# Patient Record
Sex: Male | Born: 1980 | Race: White | Hispanic: No | Marital: Married | Smoking: Never smoker
Health system: Southern US, Community
[De-identification: ages and names within clinical notes are randomized; demographics above are authoritative.]

## PROBLEM LIST (undated history)

## (undated) DIAGNOSIS — IMO0001 Reserved for inherently not codable concepts without codable children: Secondary | ICD-10-CM

## (undated) DIAGNOSIS — K219 Gastro-esophageal reflux disease without esophagitis: Secondary | ICD-10-CM

---

## 2011-02-11 ENCOUNTER — Encounter (HOSPITAL_BASED_OUTPATIENT_CLINIC_OR_DEPARTMENT_OTHER): Payer: Self-pay | Admitting: *Deleted

## 2011-02-11 ENCOUNTER — Emergency Department (HOSPITAL_BASED_OUTPATIENT_CLINIC_OR_DEPARTMENT_OTHER)
Admission: EM | Admit: 2011-02-11 | Discharge: 2011-02-12 | Disposition: A | Discharge status: Home / Self Care | Payer: Worker's Compensation | Attending: Emergency Medicine | Admitting: Emergency Medicine

## 2011-02-11 DIAGNOSIS — S51809A Unspecified open wound of unspecified forearm, initial encounter: Secondary | ICD-10-CM | POA: Insufficient documentation

## 2011-02-11 DIAGNOSIS — W268XXA Contact with other sharp object(s), not elsewhere classified, initial encounter: Secondary | ICD-10-CM | POA: Insufficient documentation

## 2011-02-11 DIAGNOSIS — S51811A Laceration without foreign body of right forearm, initial encounter: Secondary | ICD-10-CM

## 2011-02-11 MED ORDER — LIDOCAINE HCL 2 % IJ SOLN
INTRAMUSCULAR | Status: AC
  Administered 2011-02-11: 3 mL
  Filled 2011-02-11: qty 1

## 2011-02-11 NOTE — ED Notes (Signed)
Pt was at work when a commercial roll of alluminum foil fell on his arm pt states that the serrated edge cut his arm

## 2011-02-11 NOTE — ED Provider Notes (Signed)
History     CSN: [id number]  Arrival date & time 02/11/11  2313   First MD Initiated Contact with Patient 02/11/11 2339      Chief Complaint  Patient presents with  . Laceration    (Consider location/radiation/quality/duration/timing/severity/associated sxs/prior treatment) Patient is a 31 y.o. male presenting with skin laceration. The history is provided by the patient.  Laceration  The incident occurred less than 1 hour ago. The laceration is located on the right arm. The laceration is 3 cm in size. 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 The pain is mild. The pain has been constant since onset. He reports no foreign bodies present.    History reviewed. No pertinent past medical history.  History reviewed. No pertinent past surgical history.  History reviewed. No pertinent family history.  History  Substance Use Topics  . Smoking status: Never Smoker   . Smokeless tobacco: Not on file  . Alcohol Use: No      Review of Systems  All other systems reviewed and are negative.    Allergies  Review of patient's allergies indicates no known allergies.  Home Medications  No current outpatient prescriptions on file.  BP 151/95  Pulse 67  Temp(Src) 98.2 F (36.8 C) (Oral)  Resp 16  SpO2 98%  Physical Exam  Nursing note and vitals reviewed. Constitutional: He is oriented to person, place, and time. He appears well-developed and well-nourished.  HENT:  Head: Normocephalic and atraumatic.  Neck: Normal range of motion. Neck supple.  Musculoskeletal:       The lateral aspect of the right forearm just distal to the elbow has a 3 cm laceration.  There is no muscle involvement.  Neurological: He is alert and oriented to person, place, and time.  Skin: Skin is warm and dry.    ED Course  Procedures (including critical care time)  Labs Reviewed - No data to display No results found.   No diagnosis found.  LACERATION  REPAIR Performed by: Geoffery Lyons Authorized by: Geoffery Lyons Consent: Verbal consent obtained. Risks and benefits: risks, benefits and alternatives were discussed Consent given by: patient Patient identity confirmed: provided demographic data Prepped and Draped in normal sterile fashion Wound explored  Laceration Location: right forearm  Laceration Length: 3 cm  No Foreign Bodies seen or palpated  Anesthesia: local infiltration  Local anesthetic: lidocaine 1% without epinephrine  Anesthetic total: 2 ml  Irrigation method: syringe Amount of cleaning: standard  Skin closure: 4-0 prolene  Number of sutures: 5  Technique: simple interrupted.  Patient tolerance: Patient tolerated the procedure well with no immediate complications.   MDM  Local wound care, sutures out in one week.        Geoffery Lyons, MD 02/11/11 [phone]

## 2011-02-12 MED ORDER — TETANUS-DIPHTH-ACELL PERTUSSIS 5-2.5-18.5 LF-MCG/0.5 IM SUSP
0.5000 mL | Freq: Once | INTRAMUSCULAR | Status: AC
  Administered 2011-02-12: 0.5 mL via INTRAMUSCULAR
  Filled 2011-02-12: qty 0.5

## 2012-03-05 ENCOUNTER — Emergency Department (HOSPITAL_BASED_OUTPATIENT_CLINIC_OR_DEPARTMENT_OTHER)
Admission: EM | Admit: 2012-03-05 | Discharge: 2012-03-05 | Disposition: A | Discharge status: Home / Self Care | Payer: Worker's Compensation | Attending: Emergency Medicine | Admitting: Emergency Medicine

## 2012-03-05 ENCOUNTER — Encounter (HOSPITAL_BASED_OUTPATIENT_CLINIC_OR_DEPARTMENT_OTHER): Payer: Self-pay | Admitting: *Deleted

## 2012-03-05 DIAGNOSIS — Y990 Civilian activity done for income or pay: Secondary | ICD-10-CM | POA: Insufficient documentation

## 2012-03-05 DIAGNOSIS — Y9289 Other specified places as the place of occurrence of the external cause: Secondary | ICD-10-CM | POA: Insufficient documentation

## 2012-03-05 DIAGNOSIS — S058X9A Other injuries of unspecified eye and orbit, initial encounter: Secondary | ICD-10-CM | POA: Insufficient documentation

## 2012-03-05 DIAGNOSIS — H53149 Visual discomfort, unspecified: Secondary | ICD-10-CM | POA: Insufficient documentation

## 2012-03-05 DIAGNOSIS — Y939 Activity, unspecified: Secondary | ICD-10-CM | POA: Insufficient documentation

## 2012-03-05 DIAGNOSIS — S0500XA Injury of conjunctiva and corneal abrasion without foreign body, unspecified eye, initial encounter: Secondary | ICD-10-CM

## 2012-03-05 DIAGNOSIS — IMO0002 Reserved for concepts with insufficient information to code with codable children: Secondary | ICD-10-CM | POA: Insufficient documentation

## 2012-03-05 MED ORDER — FLUORESCEIN SODIUM 1 MG OP STRP
ORAL_STRIP | OPHTHALMIC | Status: AC
  Filled 2012-03-05: qty 1

## 2012-03-05 MED ORDER — TETRACAINE HCL 0.5 % OP SOLN
OPHTHALMIC | Status: AC
  Filled 2012-03-05: qty 2

## 2012-03-05 MED ORDER — MOXIFLOXACIN HCL 0.5 % OP SOLN: 1.0000 [drp] | Freq: Three times a day (TID) | OPHTHALMIC | Status: DC

## 2012-03-05 NOTE — ED Notes (Signed)
Pt not wearing corrective lenses during visual screening.

## 2012-03-05 NOTE — ED Notes (Signed)
Left eye injury at work last night-states was hit with cardboard to fae/eye-eye was flushed-cont'd to have FB sensation

## 2012-03-05 NOTE — ED Provider Notes (Signed)
History     CSN: [id number]  Arrival date & time 03/05/12  1806   First MD Initiated Contact with Patient 03/05/12 1829      Chief Complaint  Patient presents with  . Eye Injury    (Consider location/radiation/quality/duration/timing/severity/associated sxs/prior treatment) Patient is a 32 y.o. male presenting with eye injury. The history is provided by the patient.  Eye Injury   Patient with left eye injury that occurred last at work. Had a piece of carpet scrape against his left thigh. His eye was irrigated at that time and he continues to perform by sensation. Some eye drainage with photophobia. Some blurred vision but no visual loss. No other complaints History reviewed. No pertinent past medical history.  History reviewed. No pertinent past surgical history.  No family history on file.  History  Substance Use Topics  . Smoking status: Never Smoker   . Smokeless tobacco: Not on file  . Alcohol Use: No      Review of Systems  All other systems reviewed and are negative.    Allergies  Review of patient's allergies indicates no known allergies.  Home Medications  No current outpatient prescriptions on file.  BP 129/74  Pulse 75  Temp 98.2 F (36.8 C) (Oral)  Resp 16  Ht 5\' 9"  (1.753 m)  Wt 185 lb (83.915 kg)  BMI 27.32 kg/m2  SpO2 100%  Physical Exam  Nursing note and vitals reviewed. Constitutional: He is oriented to person, place, and time. He appears well-developed and well-nourished.  Non-toxic appearance.  HENT:  Head: Normocephalic and atraumatic.  Eyes: Pupils are equal, round, and reactive to light. Left eye exhibits discharge. No foreign body present in the left eye. Left conjunctiva is injected.       Fluorescein uptake noted at the 5:00 region of the left eye.  Neck: Normal range of motion.  Cardiovascular: Normal rate.   Pulmonary/Chest: Effort normal.  Neurological: He is alert and oriented to person, place, and time.  Skin: Skin is warm  and dry.  Psychiatric: He has a normal mood and affect.    ED Course  Procedures (including critical care time)  Labs Reviewed - No data to display No results found.   No diagnosis found.    MDM  Patient to be treated for corneal abrasion and will followup with his ophthalmologist        Toy Baker, MD 03/05/12 1843

## 2015-01-05 ENCOUNTER — Emergency Department (HOSPITAL_BASED_OUTPATIENT_CLINIC_OR_DEPARTMENT_OTHER)
Admission: EM | Admit: 2015-01-05 | Discharge: 2015-01-05 | Disposition: A | Discharge status: Home / Self Care | Payer: BLUE CROSS/BLUE SHIELD | Attending: Emergency Medicine | Admitting: Emergency Medicine

## 2015-01-05 ENCOUNTER — Emergency Department (HOSPITAL_BASED_OUTPATIENT_CLINIC_OR_DEPARTMENT_OTHER): Payer: BLUE CROSS/BLUE SHIELD

## 2015-01-05 ENCOUNTER — Encounter (HOSPITAL_BASED_OUTPATIENT_CLINIC_OR_DEPARTMENT_OTHER): Payer: Self-pay

## 2015-01-05 DIAGNOSIS — Y998 Other external cause status: Secondary | ICD-10-CM | POA: Insufficient documentation

## 2015-01-05 DIAGNOSIS — S39012A Strain of muscle, fascia and tendon of lower back, initial encounter: Secondary | ICD-10-CM | POA: Insufficient documentation

## 2015-01-05 DIAGNOSIS — Y92410 Unspecified street and highway as the place of occurrence of the external cause: Secondary | ICD-10-CM | POA: Insufficient documentation

## 2015-01-05 DIAGNOSIS — S299XXA Unspecified injury of thorax, initial encounter: Secondary | ICD-10-CM | POA: Insufficient documentation

## 2015-01-05 DIAGNOSIS — R05 Cough: Secondary | ICD-10-CM | POA: Insufficient documentation

## 2015-01-05 DIAGNOSIS — Y9389 Activity, other specified: Secondary | ICD-10-CM | POA: Insufficient documentation

## 2015-01-05 DIAGNOSIS — S199XXA Unspecified injury of neck, initial encounter: Secondary | ICD-10-CM | POA: Diagnosis present

## 2015-01-05 DIAGNOSIS — S161XXA Strain of muscle, fascia and tendon at neck level, initial encounter: Secondary | ICD-10-CM | POA: Insufficient documentation

## 2015-01-05 DIAGNOSIS — S0990XA Unspecified injury of head, initial encounter: Secondary | ICD-10-CM | POA: Diagnosis not present

## 2015-01-05 MED ORDER — NAPROXEN 500 MG PO TABS: 500.0000 mg | ORAL_TABLET | Freq: Two times a day (BID) | ORAL | Status: DC
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
## 2015-01-05 NOTE — Discharge Instructions (Signed)
X-rays and CT scans were negative for any acute bony injuries. Take the Naprosyn as directed. Make an appointment to follow-up with your doctor. If not improving over the next few days or for any new or worse symptoms return here.   Emergency Department Resource Guide 1) Find a Doctor and Pay Out of Pocket Although you won't have to find out who is covered by your insurance plan, it is a good idea to ask around and get recommendations. You will then need to call the office and see if the doctor you have chosen will accept you as a new patient and what types of options they offer for patients who are self-pay. Some doctors offer discounts or will set up payment plans for their patients who do not have insurance, but you will need to ask so you aren't surprised when you get to your appointment.  2) Contact Your Local Health Department Not all health departments have doctors that can see patients for sick visits, but many do, so it is worth a call to see if yours does. If you don't know where your local health department is, you can check in your phone book. The CDC also has a tool to help you locate your state's health department, and many state websites also have listings of all of their local health departments.  3) Find a Walk-in Clinic If your illness is not likely to be very severe or complicated, you may want to try a walk in clinic. These are popping up all over the country in pharmacies, drugstores, and shopping centers. They're usually staffed by nurse practitioners or physician assistants that have been trained to treat common illnesses and complaints. They're usually fairly quick and inexpensive. However, if you have serious medical issues or chronic medical problems, these are probably not your best option.  No Primary Care Doctor: - Call Health Connect at  [phone] - they can help you locate a primary care doctor that  accepts your insurance, provides certain services, etc. - Physician  Referral Service- [phone]  Chronic Pain Problems: Organization         Address  Phone   Notes  [facility]  [phone] Patients need to be referred by their primary care doctor.   Medication Assistance: Organization         Address  Phone   Notes  [facility]ication [facility] [address] [place], [place] [address] [phone] --Must be a resident of [facility] [place] -- Must have NO insurance coverage whatsoever (no Medicaid/ Medicare, etc.) -- The pt. MUST have a primary care doctor that directs their care regularly and follows them in the community   [facility]ist  [phone]   [employer]  [phone]    Agencies that provide inexpensive medical care: Organization         Address  Phone   Notes  [facility]  [phone]   [facility]    [phone]   [facility] [address] [place], [place] [address] [phone]   [facility] [address], [place] [phone]   [facility]anned Parenthood    [phone]   [facility]    [phone]   [facility]  [address], [place] Phone:  [phone], Fax:  [phone] Hours of Operation:  9 am - 6 pm, M-F.  Also accepts Medicaid/Medicare  and self-pay.  [facility] for [place], [address], [place] Phone: [phone], Fax: [phone]. Hours of Operation:  8:30 am - 5:30 pm, M-F.  Also accepts Medicaid and self-pay.  [facility] [place] [address] [place] Phone: [phone]   [place], [place], [place] [phone], Ext. [phone] Mondays & Thursdays: 7-9 AM.  First 15 patients are seen on a first come, first serve basis.    [place] Providers:  Organization         Address  Phone   Notes  [facility] [address],  [phone] Also accepts self-pay patients.  [facility] [address], [place]  [phone]   [place], [address], [place] [phone]   [facility] [address], [place] [phone]   Lucianne Lei [address], [place]   [phone] Only accepts [place] Access [place] patients after they have their name applied to their card.   Self-Pay (no insurance) in [facility]:  Organization         Address  Phone   Notes  Sickle Cell Patients, [facility] [place] [phone]   [facility] [place] [phone]   [facility] [place]  [place], [place],  [phone]   [facility]/Dr. Osei-Bonsu  [address], [place] or [address], [place] [phone] Phone number for both [place] and [place] locations is the same.  Urgent Medical and [facility] [address], [place] [phone]   [facility] [address] [place] or [address] [phone]   [facility] [address], [place] [phone], phone; [phone], fax Sees patients 1st and 3rd Saturday of every month.  Must not qualify for public or private insurance (i.e. Medicaid, Medicare, [place] Health Choice, Veterans' Benefits)  Household income should be no more than 200% of the poverty level The clinic cannot treat you if you are pregnant or think you are pregnant  Sexually transmitted diseases are not treated at the clinic.    Dental Care: Organization         Address  Phone  Notes  [facility] Department of [facility] [place] [phone] Accepts children up to age 51 who are enrolled  in [place] or [place]; pregnant women with a Medicaid card; and children who have applied for Medicaid or [place] Health Choice, but were declined, whose parents can pay a reduced fee at time of service.  [facility] Department of [facility]  [address], [place] [phone] Accepts children up to age 68 who are enrolled in [place] or [place]; pregnant women with a Medicaid card; and children who have applied for Medicaid or  Health Choice, but were declined, whose parents can pay a reduced fee at time of service.  [facility]  [place] [phone] Patients are seen by appointment only. Walk-ins are not accepted. [place] will see patients 22 years of age and older. Monday - Tuesday (8am-5pm) Most Wednesdays (  8:30-5pm) $30 per visit, cash only  [facility] Adult [facility] Access PROGRAM  [address], [facility] [phone] Patients are seen by appointment only. Walk-ins are not accepted. [place] will see patients 65 years of age and older. One Wednesday Evening (Monthly: Volunteer Based).  $30 per visit, cash only  [place]  [phone] for adults; Children under age 65, call Graduate Pediatric Dentistry at [phone]. Children aged 30-14, please call [phone] to request a pediatric application.  Dental services are provided in all areas of dental care including fillings, crowns and bridges, complete and partial dentures, implants, gum treatment, root canals, and extractions. Preventive care is also provided. Treatment is provided to both adults and children. Patients are selected via a lottery and there is often a waiting list.   [facility] [address], [place]  [phone] [email or web address]   [facility] [address] [place], [place] [phone], Ext. [phone] Second and Fourth Thursday of each month, opens at  6:30 AM; Clinic ends at 9 AM.  Patients are seen on a first-come first-served basis, and a limited number are seen during each clinic.   [facility]  [address] Hillard Danker [place], [place] [phone]   Eligibility Requirements You must have lived in [place], [place], or [place] counties for at least the last three months.   You cannot be eligible for state or federal sponsored [employer], including [employer], [place], or [employer].   You generally cannot be eligible for healthcare insurance through your employer.    How to apply: Eligibility screenings are held every Tuesday and Wednesday afternoon from 1:00 pm until 4:00 pm. You do not need an appointment for the interview!  [facility] [address] [place], [place]   [place]  [place] Department  [place]  [phone]    Behavioral Health Resources in the Community: Intensive Outpatient Programs Organization         Address  Phone  Notes  [place]. [address] [place], [place] [phone]   [facility] [address] [place], [place]   ADS: Alcohol & Drug Svcs [address], [place], [place]   [place] [address],  [place], [place] or [phone]   Substance Abuse Resources Organization         Address  Phone  Notes  Alcohol and Drug Services  [phone]   [place]  [phone]   The [place]   Chinita Pester  [phone]   Residential & Outpatient Substance Abuse Program  [phone]   Psychological Services Organization         Address  Phone  Notes  [facility]  [place]  [phone]   [place] [address] [place] or [phone]    Mobile Crisis Teams Organization         Address  Phone  Notes  [facility], Mobile Crisis Care Unit  [phone]   [facility]  [address] [place], [place]   [place] Levels [address] [place] [phone]    Self-Help/Support Groups Organization         Address  Phone  Notes  [facility]ental Health Assoc[facility] of [place] - variety of support groups  [place] Call for more information  Narcotics Anonymous (NA), [facility] [address], [employer] [place]  2 meetings at this location   Special educational needs teacher         Address  Phone  Notes  ASAP Residential Treatment [place],    [place]  [phone]   [facility]  [address], [place] [phone], [place], [place]   [place], [place] [phone] Admissions: 8am-3pm M-F  Incentives Substance [place] [address],    [place], [place] [phone]   [facility] [address] [place], [place], [place]   [facility] [address],  [place], [place]   Insight Programs - Intensive Outpatient [address] Kristeen Mans [address], [place], [place]   [facility] ([place] [address].,  [place], [place] [phone] or [phone]   [facility] Services (RTS) [address] [place], [place] Accepts Medicaid  [place] [address]  [place] [phone] Substance Abuse/Addiction Treatment   [facility] Organization         Address  Phone  Notes  [facility]  [phone]   Domenic Schwab, PhD [address] Arlis Porta [place], [place]   [phone] or [phone]   [place], [place] [phone]   [facility]  [address] [place], [place] [phone] Insurance/Medicaid/sponsorship through [facility] and Families [address], [address] [place]                                    [place], [place] [phone] [place] [address], [place] [phone]    Dr. Adele Schilder  [phone]   [facility] Dept. 1) [address], [place] 2) [place] 3)  [place] [address], [place] [phone]  [phone]   [place] [phone] or [phone] (After Hours)
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
## 2015-01-12 ENCOUNTER — Emergency Department (INDEPENDENT_AMBULATORY_CARE_PROVIDER_SITE_OTHER)
Admission: EM | Admit: 2015-01-12 | Discharge: 2015-01-12 | Disposition: A | Discharge status: Home / Self Care | Payer: BLUE CROSS/BLUE SHIELD | Source: Home / Self Care | Attending: Family Medicine | Admitting: Family Medicine

## 2015-01-12 ENCOUNTER — Encounter: Payer: Self-pay | Admitting: *Deleted

## 2015-01-12 DIAGNOSIS — S335XXA Sprain of ligaments of lumbar spine, initial encounter: Secondary | ICD-10-CM | POA: Diagnosis not present

## 2015-01-12 DIAGNOSIS — S29012A Strain of muscle and tendon of back wall of thorax, initial encounter: Secondary | ICD-10-CM | POA: Diagnosis not present

## 2015-01-12 DIAGNOSIS — S239XXA Sprain of unspecified parts of thorax, initial encounter: Secondary | ICD-10-CM

## 2015-01-12 DIAGNOSIS — S139XXA Sprain of joints and ligaments of unspecified parts of neck, initial encounter: Secondary | ICD-10-CM

## 2015-01-12 DIAGNOSIS — S134XXA Sprain of ligaments of cervical spine, initial encounter: Secondary | ICD-10-CM

## 2015-01-12 HISTORY — DX: Reserved for inherently not codable concepts without codable children: IMO0001

## 2015-01-12 HISTORY — DX: Gastro-esophageal reflux disease without esophagitis: K21.9

## 2015-01-12 MED ORDER — CYCLOBENZAPRINE HCL 10 MG PO TABS: ORAL_TABLET | ORAL | Status: DC

## 2015-01-12 MED ORDER — PREDNISONE 20 MG PO TABS: 20.0000 mg | ORAL_TABLET | Freq: Two times a day (BID) | ORAL | Status: DC

## 2015-01-12 MED ORDER — MELOXICAM 15 MG PO TABS: 15.0000 mg | ORAL_TABLET | Freq: Every day | ORAL | Status: DC
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
## 2015-01-12 NOTE — Discharge Instructions (Signed)
May alternate applying ice packs with heat, 3 to 4 times daily  Continue until pain decreases.  Begin meloxicam after finishing prednisone.  Discontinue naproxen.

## 2015-01-20 ENCOUNTER — Encounter: Payer: Self-pay | Admitting: Family Medicine

## 2015-01-20 ENCOUNTER — Ambulatory Visit (INDEPENDENT_AMBULATORY_CARE_PROVIDER_SITE_OTHER): Payer: BLUE CROSS/BLUE SHIELD | Admitting: Family Medicine

## 2015-01-20 VITALS — BP 143/95 | HR 84 | Wt 188.0 lb

## 2015-01-20 DIAGNOSIS — S39012A Strain of muscle, fascia and tendon of lower back, initial encounter: Secondary | ICD-10-CM

## 2015-01-20 DIAGNOSIS — M6249 Contracture of muscle, multiple sites: Secondary | ICD-10-CM

## 2015-01-20 DIAGNOSIS — M545 Low back pain, unspecified: Secondary | ICD-10-CM | POA: Insufficient documentation

## 2015-01-20 DIAGNOSIS — S338XXA Sprain of other parts of lumbar spine and pelvis, initial encounter: Secondary | ICD-10-CM

## 2015-01-20 DIAGNOSIS — M62838 Other muscle spasm: Secondary | ICD-10-CM | POA: Insufficient documentation
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
## 2015-01-20 NOTE — Assessment & Plan Note (Addendum)
Refer to physical therapy. Heating pads TENS units. Recheck in 2 weeks. Patient may have a radicular component. Will continue to follow and potentially obtain MRI if symptoms persist or worsen.

## 2015-01-20 NOTE — Patient Instructions (Addendum)
Thank you for coming in today. Come back or go to the emergency room if you notice new weakness new numbness problems walking or bowel or bladder problems.  Attend physical therapy.  Call [phone] to schedule an appointment or go to the office in this building to schedule in person.   Return in 2-3 weeks.   TENS UNIT: This is helpful for muscle pain and spasm.   Search and Purchase a TENS 7000 2nd edition at www.tenspros.com. It should be less than $30.     TENS unit instructions: Do not shower or bathe with the unit on Turn the unit off before removing electrodes or batteries If the electrodes lose stickiness add a drop of water to the electrodes after they are disconnected from the unit and place on plastic sheet. If you continued to have difficulty, call the TENS unit company to purchase more electrodes. Do not apply lotion on the skin area prior to use. Make sure the skin is clean and dry as this will help prolong the life of the electrodes. After use, always check skin for unusual red areas, rash or other skin difficulties. If there are any skin problems, does not apply electrodes to the same area. Never remove the electrodes from the unit by pulling the wires. Do not use the TENS unit or electrodes other than as directed. Do not change electrode placement without consultating your therapist or physician. Keep 2 fingers with between each electrode. Wear time ratio is 2:1, on to off times.    For example on for 30 minutes off for 15 minutes and then on for 30 minutes off for 15 minutes    Lumbosacral Strain Lumbosacral strain is a strain of any of the parts that make up your lumbosacral vertebrae. Your lumbosacral vertebrae are the bones that make up the lower third of your backbone. Your lumbosacral vertebrae are held together by muscles and tough, fibrous tissue (ligaments).  CAUSES  A sudden blow to your back can cause lumbosacral strain. Also, anything that causes an  excessive stretch of the muscles in the low back can cause this strain. This is typically seen when people exert themselves strenuously, fall, lift heavy objects, bend, or crouch repeatedly. RISK FACTORS  Physically demanding work.  Participation in pushing or pulling sports or sports that require a sudden twist of the back (tennis, golf, baseball).  Weight lifting.  Excessive lower back curvature.  Forward-tilted pelvis.  Weak back or abdominal muscles or both.  Tight hamstrings. SIGNS AND SYMPTOMS  Lumbosacral strain may cause pain in the area of your injury or pain that moves (radiates) down your leg.  DIAGNOSIS Your health care provider can often diagnose lumbosacral strain through a physical exam. In some cases, you may need tests such as X-ray exams.  TREATMENT  Treatment for your lower back injury depends on many factors that your clinician will have to evaluate. However, most treatment will include the use of anti-inflammatory medicines. HOME CARE INSTRUCTIONS   Avoid hard physical activities (tennis, racquetball, waterskiing) if you are not in proper physical condition for it. This may aggravate or create problems.  If you have a back problem, avoid sports requiring sudden body movements. Swimming and walking are generally safer activities.  Maintain good posture.  Maintain a healthy weight.  For acute conditions, you may put ice on the injured area.  Put ice in a plastic bag.  Place a towel between your skin and the bag.  Leave the ice on for 20  minutes, 2-3 times a day.  When the low back starts healing, stretching and strengthening exercises may be recommended. SEEK MEDICAL CARE IF:  Your back pain is getting worse.  You experience severe back pain not relieved with medicines. SEEK IMMEDIATE MEDICAL CARE IF:   You have numbness, tingling, weakness, or problems with the use of your arms or legs.  There is a change in bowel or bladder control.  You have  increasing pain in any area of the body, including your belly (abdomen).  You notice shortness of breath, dizziness, or feel faint.  You feel sick to your stomach (nauseous), are throwing up (vomiting), or become sweaty.  You notice discoloration of your toes or legs, or your feet get very cold. MAKE SURE YOU:   Understand these instructions.  Will watch your condition.  Will get help right away if you are not doing well or get worse.   This information is not intended to replace advice given to you by your health care provider. Make sure you discuss any questions you have with your health care provider.   Document Released: 10/25/2004 Document Revised: 02/05/2014 Document Reviewed: 09/03/2012 Elsevier Interactive Patient Education 2016 [employer] Inc[employer]   Cervical Sprain A cervical sprain is an injury in the neck in which the strong, fibrous tissues (ligaments) that connect your neck bones stretch or tear. Cervical sprains can range from mild to severe. Severe cervical sprains can cause the neck vertebrae to be unstable. This can lead to damage of the spinal cord and can result in serious nervous system problems. The amount of time it takes for a cervical sprain to get better depends on the cause and extent of the injury. Most cervical sprains heal in 1 to 3 weeks. CAUSES  Severe cervical sprains may be caused by:   Contact sport injuries (such as from football, rugby, wrestling, hockey, auto racing, gymnastics, diving, martial arts, or boxing).   Motor vehicle collisions.   Whiplash injuries. This is an injury from a sudden forward and backward whipping movement of the head and neck.  Falls.  Mild cervical sprains may be caused by:   Being in an awkward position, such as while cradling a telephone between your ear and shoulder.   Sitting in a chair that does not offer proper support.   Working at a poorly Marketing executive station.   Looking up or down for long periods  of time.  SYMPTOMS   Pain, soreness, stiffness, or a burning sensation in the front, back, or sides of the neck. This discomfort may develop immediately after the injury or slowly, 24 hours or more after the injury.   Pain or tenderness directly in the middle of the back of the neck.   Shoulder or upper back pain.   Limited ability to move the neck.   Headache.   Dizziness.   Weakness, numbness, or tingling in the hands or arms.   Muscle spasms.   Difficulty swallowing or chewing.   Tenderness and swelling of the neck.  DIAGNOSIS  Most of the time your health care provider can diagnose a cervical sprain by taking your history and doing a physical exam. Your health care provider will ask about previous neck injuries and any known neck problems, such as arthritis in the neck. X-rays may be taken to find out if there are any other problems, such as with the bones of the neck. Other tests, such as a CT scan or MRI, may also be needed.  TREATMENT  Treatment depends on the severity of the cervical sprain. Mild sprains can be treated with rest, keeping the neck in place (immobilization), and pain medicines. Severe cervical sprains are immediately immobilized. Further treatment is done to help with pain, muscle spasms, and other symptoms and may include:  Medicines, such as pain relievers, numbing medicines, or muscle relaxants.   Physical therapy. This may involve stretching exercises, strengthening exercises, and posture training. Exercises and improved posture can help stabilize the neck, strengthen muscles, and help stop symptoms from returning.  HOME CARE INSTRUCTIONS   Put ice on the injured area.   Put ice in a plastic bag.   Place a towel between your skin and the bag.   Leave the ice on for 15-20 minutes, 3-4 times a day.   If your injury was severe, you may have been given a cervical collar to wear. A cervical collar is a two-piece collar designed to keep  your neck from moving while it heals.  Do not remove the collar unless instructed by your health care provider.  If you have long hair, keep it outside of the collar.  Ask your health care provider before making any adjustments to your collar. Minor adjustments may be required over time to improve comfort and reduce pressure on your chin or on the back of your head.  Ifyou are allowed to remove the collar for cleaning or bathing, follow your health care provider's instructions on how to do so safely.  Keep your collar clean by wiping it with mild soap and water and drying it completely. If the collar you have been given includes removable pads, remove them every 1-2 days and hand wash them with soap and water. Allow them to air dry. They should be completely dry before you wear them in the collar.  If you are allowed to remove the collar for cleaning and bathing, wash and dry the skin of your neck. Check your skin for irritation or sores. If you see any, tell your health care provider.  Do not drive while wearing the collar.   Only take over-the-counter or prescription medicines for pain, discomfort, or fever as directed by your health care provider.   Keep all follow-up appointments as directed by your health care provider.   Keep all physical therapy appointments as directed by your health care provider.   Make any needed adjustments to your workstation to promote good posture.   Avoid positions and activities that make your symptoms worse.   Warm up and stretch before being active to help prevent problems.  SEEK MEDICAL CARE IF:   Your pain is not controlled with medicine.   You are unable to decrease your pain medicine over time as planned.   Your activity level is not improving as expected.  SEEK IMMEDIATE MEDICAL CARE IF:   You develop any bleeding.  You develop stomach upset.  You have signs of an allergic reaction to your medicine.   Your symptoms get  worse.   You develop new, unexplained symptoms.   You have numbness, tingling, weakness, or paralysis in any part of your body.  MAKE SURE YOU:   Understand these instructions.  Will watch your condition.  Will get help right away if you are not doing well or get worse.   This information is not intended to replace advice given to you by your health care provider. Make sure you discuss any questions you have with your health care provider.   Document Released: 11/12/2006 Document  Revised: 01/20/2013 Document Reviewed: 07/23/2012 Elsevier Interactive Patient Education [employer]

## 2015-01-20 NOTE — Assessment & Plan Note (Signed)
Refer to physical therapy. Heating pads TENS units. Recheck in 2 weeks.

## 2015-01-27 ENCOUNTER — Ambulatory Visit: Payer: Self-pay | Admitting: Physical Therapy

## 2015-02-07 ENCOUNTER — Ambulatory Visit: Payer: Self-pay | Admitting: Rehabilitative and Restorative Service Providers"

## 2015-02-14 ENCOUNTER — Ambulatory Visit: Payer: Self-pay | Admitting: Family Medicine

## 2015-02-22 ENCOUNTER — Encounter: Payer: Self-pay | Admitting: Physical Therapy

## 2015-02-22 ENCOUNTER — Ambulatory Visit (INDEPENDENT_AMBULATORY_CARE_PROVIDER_SITE_OTHER): Payer: BLUE CROSS/BLUE SHIELD | Admitting: Physical Therapy

## 2015-02-22 DIAGNOSIS — M542 Cervicalgia: Secondary | ICD-10-CM | POA: Diagnosis not present

## 2015-02-22 DIAGNOSIS — M436 Torticollis: Secondary | ICD-10-CM | POA: Diagnosis not present

## 2015-02-22 DIAGNOSIS — M2569 Stiffness of other specified joint, not elsewhere classified: Secondary | ICD-10-CM

## 2015-02-22 DIAGNOSIS — M2560 Stiffness of unspecified joint, not elsewhere classified: Secondary | ICD-10-CM

## 2015-02-22 DIAGNOSIS — M545 Low back pain, unspecified: Secondary | ICD-10-CM
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
## 2015-02-22 NOTE — Patient Instructions (Signed)
Decompression Exercise: Basic    Lie on back on firm surface, knees bent, feet flat, arms turned up, out to sides, backs of hands down. Time ___ minutes. Surface: floor    Decompression Exercise: Leg Support    Lie on back on firm surface arms turned up, out to sides, backs of hands down. Support under legs: 90/90 position. Time ___ minutes. Surface: floor     Knee-to-Chest Stretch: Unilateral    With hand behind right knee, pull knee in to chest until a comfortable stretch is felt in lower back and buttocks. Keep back relaxed. Hold _20-30___ seconds. Repeat __1-2__ times per set. Do ___1_ sets per session. Do 2-3____ sessions per day.    Lumbar Rotation (Non-Weight Bearing)    Feet on floor, slowly rock knees from side to side in small, pain-free range of motion. Allow lower back to rotate slightly. Repeat __10__ times per set. Do _1___ sets per session. Do 2-3____ sessions per day.    Lower Back Stretch (Sitting)    Sit in chair with knees spread apart. Bend forward to floor. A comfortable stretch should be felt in lower back. Hold _20-30___ seconds. Repeat _1___ times per set. Do __1__ sets per session. Do _2-3___ sessions per day.   Flexibility: Upper Trapezius Stretch    Gently grasp right side of head while reaching behind back with other hand. Tilt head away until a gentle stretch is felt. Hold _20-30___ seconds. Repeat _1-2___ times per set. Do __1__ sets per session. Do __2-3__ sessions per day.   Strengthening: Shoulder Shrug (Phase 1)    Shrug shoulders up and down, forward and backward. Repeat __10__ times per set. Do _1___ sets per session. Do _4-5___ sessions per day.   Flexibility: Neck Retraction    Pull head straight back, keeping eyes and jaw level. Repeat _5-10___ times per set. Do __1__ sets per session. Do _4-5___ sessions per day. Copyright  VHI. All rights reserved.   Trigger Point Dry Needling  . What is Trigger Point Dry  Needling (DN)? o DN is a physical therapy technique used to treat muscle pain and dysfunction. Specifically, DN helps deactivate muscle trigger points (muscle knots).  o A thin filiform needle is used to penetrate the skin and stimulate the underlying trigger point. The goal is for a local twitch response (LTR) to occur and for the trigger point to relax. No medication of any kind is injected during the procedure.   . What Does Trigger Point Dry Needling Feel Like?  o The procedure feels different for each individual patient. Some patients report that they do not actually feel the needle enter the skin and overall the process is not painful. Very mild bleeding may occur. However, many patients feel a deep cramping in the muscle in which the needle was inserted. This is the local twitch response.   Marland Kitchen How Will I feel after the treatment? o Soreness is normal, and the onset of soreness may not occur for a few hours. Typically this soreness does not last longer than two days.  o Bruising is uncommon, however; ice can be used to decrease any possible bruising.  o In rare cases feeling tired or nauseous after the treatment is normal. In addition, your symptoms may get worse before they get better, this period will typically not last longer than 24 hours.   . What Can I do After My Treatment? o Increase your hydration by drinking more water for the next 24 hours. o You may  place ice or heat on the areas treated that have become sore, however, do not use heat on inflamed or bruised areas. Heat often brings more relief post needling. o You can continue your regular activities, but vigorous activity is not recommended initially after the treatment for 24 hours. o DN is best combined with other physical therapy such as strengthening, stretching, and other therapies.

## 2015-02-24 ENCOUNTER — Ambulatory Visit (INDEPENDENT_AMBULATORY_CARE_PROVIDER_SITE_OTHER): Payer: BLUE CROSS/BLUE SHIELD | Admitting: Physical Therapy

## 2015-02-24 ENCOUNTER — Encounter: Payer: Self-pay | Admitting: Physical Therapy

## 2015-02-24 DIAGNOSIS — M545 Low back pain, unspecified: Secondary | ICD-10-CM

## 2015-02-24 DIAGNOSIS — M542 Cervicalgia: Secondary | ICD-10-CM

## 2015-02-24 DIAGNOSIS — M436 Torticollis: Secondary | ICD-10-CM | POA: Diagnosis not present

## 2015-02-24 DIAGNOSIS — M2569 Stiffness of other specified joint, not elsewhere classified: Secondary | ICD-10-CM

## 2015-02-24 DIAGNOSIS — M2560 Stiffness of unspecified joint, not elsewhere classified: Secondary | ICD-10-CM

## 2015-02-24 NOTE — Therapy (Signed)
[facility] [place] [address] [place] [address] [place], [place], [address] Phone: [phone]   Fax:  [phone]  Physical Therapy Treatment  Patient Details  Name: Rishawn Walck MRN: [id number] Date of Birth: 03-Dec-1980 Referring Provider: Dr Teressa Lower  Encounter Date: 02/24/2015      PT End of Session - 02/24/15 0800    Visit Number 2   Number of Visits 12   Date for PT Re-Evaluation 04/05/15   PT Start Time 0800      Past Medical History  Diagnosis Date  . Reflux     History reviewed. No pertinent past surgical history.  There were no vitals filed for this visit.  Visit Diagnosis:  Pain, neck  Bilateral low back pain without sciatica  Back stiffness  Stiffness of neck      Subjective Assessment - 02/24/15 0800    Subjective Pt states he has some different soreness now, into Lt HS and mid back . doing OK with HEP   Currently in Pain? Yes   Pain Score 6    Pain Location Back   Pain Orientation Lower   Multiple Pain Sites Yes   Pain Score 4   Pain Location Thoracic   Pain Orientation Mid;Right   Pain Descriptors / Indicators Aching                         [facility] Adult PT Treatment/Exercise - 02/24/15 0001    Neck Exercises: Machines for Strengthening   UBE (Upper Arm Bike) L2x4' alt FWD/BWD   Neck Exercises: Supine   Cervical Isometrics 15 reps  head press, with tongue press to roof of mouth   Lumbar Exercises: Stretches   Passive Hamstring Stretch 1 rep;30 seconds  each side with strap   Quadruped Mid Back Stretch --  cat/cow, 20 reps   Lumbar Exercises: Supine   Bridge 10 reps  standard, the 10 articulating   Lumbar Exercises: Sidelying   Other Sidelying Lumbar Exercises upper trunk/lower trunk rotations with yellow band each side   Modalities   Modalities Electrical Stimulation;Moist Heat   Moist Heat Therapy   Number Minutes Moist Heat 15 Minutes   Moist Heat Location Lumbar Spine   thoracic   Electrical Stimulation   Electrical Stimulation Location lumbar and thoracic paraspinals   Electrical Stimulation Action IFC   Electrical Stimulation Parameters to tolerance   Electrical Stimulation Goals Tone;Pain   Manual Therapy   Manual Therapy Soft tissue mobilization   Soft tissue mobilization lumbar paraspinals and QL  and thoracic paraspinals, - pt with large band Rt T4-10          Trigger Point Dry Needling - 02/24/15 0851    Consent Given? 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  Pt with very strong twitch in his thoracic paraspinals with TDN   Pt will benefit from skilled therapeutic intervention in order to improve on the following deficits Hypomobility;Pain;Increased muscle spasms;Impaired UE functional use;Decreased range of motion   Rehab Potential Good   PT Frequency 2x / week   PT Duration 6 weeks   PT  Treatment/Interventions Ultrasound;Traction;Neuromuscular re-education;Patient/family education;Passive range of motion;Dry needling;Cryotherapy;Electrical Stimulation;Moist Heat;Therapeutic exercise;Manual techniques;Taping   PT Next Visit Plan manual therapy, TDN, modalities to decrease tone and pain.    Consulted and Agree with Plan of Care Patient        Problem List Patient Active Problem List   Diagnosis Date Noted  . Cervical paraspinal muscle spasm 01/20/2015  . Lumbosacral strain 01/20/2015    Roderic Scarce PT 02/24/2015, 8:52 AM  [facility] [address] [place] [address] [place], [place], [address] Phone: [phone]   Fax:  [phone]  Name: Ramiz Turpin MRN: [id number] Date of Birth: January 10, 1981

## 2015-03-01 ENCOUNTER — Encounter: Payer: Self-pay | Admitting: Family Medicine

## 2015-03-01 ENCOUNTER — Ambulatory Visit (INDEPENDENT_AMBULATORY_CARE_PROVIDER_SITE_OTHER): Payer: BLUE CROSS/BLUE SHIELD | Admitting: Family Medicine

## 2015-03-01 ENCOUNTER — Ambulatory Visit (INDEPENDENT_AMBULATORY_CARE_PROVIDER_SITE_OTHER): Payer: BLUE CROSS/BLUE SHIELD | Admitting: Physical Therapy

## 2015-03-01 VITALS — BP 137/98 | HR 75 | Wt 190.0 lb

## 2015-03-01 DIAGNOSIS — M545 Low back pain, unspecified: Secondary | ICD-10-CM

## 2015-03-01 DIAGNOSIS — M2569 Stiffness of other specified joint, not elsewhere classified: Secondary | ICD-10-CM

## 2015-03-01 DIAGNOSIS — M2560 Stiffness of unspecified joint, not elsewhere classified: Secondary | ICD-10-CM | POA: Diagnosis not present

## 2015-03-01 DIAGNOSIS — M436 Torticollis: Secondary | ICD-10-CM | POA: Diagnosis not present

## 2015-03-01 DIAGNOSIS — S338XXA Sprain of other parts of lumbar spine and pelvis, initial encounter: Secondary | ICD-10-CM | POA: Diagnosis not present

## 2015-03-01 DIAGNOSIS — M6249 Contracture of muscle, multiple sites: Secondary | ICD-10-CM

## 2015-03-01 DIAGNOSIS — S39012A Strain of muscle, fascia and tendon of lower back, initial encounter: Secondary | ICD-10-CM

## 2015-03-01 DIAGNOSIS — M542 Cervicalgia: Secondary | ICD-10-CM

## 2015-03-01 DIAGNOSIS — M62838 Other muscle spasm: Secondary | ICD-10-CM

## 2015-03-01 MED ORDER — DICLOFENAC SODIUM 75 MG PO TBEC: 75.0000 mg | DELAYED_RELEASE_TABLET | Freq: Two times a day (BID) | ORAL | Status: DC | PRN

## 2015-03-01 MED ORDER — METHOCARBAMOL 500 MG PO TABS: 500.0000 mg | ORAL_TABLET | Freq: Three times a day (TID) | ORAL | Status: DC

## 2015-03-01 NOTE — Patient Instructions (Signed)
Thank you for coming in today. Try to close the neck and methocarbamol. Get MRI. Return in a few days after MRI for recheck. Continue physical therapy. Come back or go to the emergency room if you notice new weakness new numbness problems walking or bowel or bladder problems.

## 2015-03-01 NOTE — Assessment & Plan Note (Signed)
Improving with physical therapy. Continue PT.

## 2015-03-01 NOTE — Patient Instructions (Signed)
  Abdominal Bracing With Pelvic Floor (Hook-Lying)   With neutral spine, tighten pelvic floor and abdominals. Hold 10 seconds. Repeat __10_ times. Do _1__ times a day.   Knee to Chest: Transverse Plane Stability   Bring one knee up, then return. Be sure pelvis does not roll side to side. Keep pelvis still. Lift knee __10_ times each leg. Restabilize pelvis. Repeat with other leg. Do _1-2__ sets, _1__ times per day.   Hip External Rotation With Pillow: Transverse Plane Stability   One knee bent, one leg straight, on pillow. Slowly roll bent knee out. Be sure pelvis does not rotate. Do _10__ times. Restabilize pelvis. Repeat with other leg. Do _1-2__ sets, _1__ times per day.  Heel Slide: 4-10 Inches - Transverse Plane Stability   Slide heel 4 inches down. Be sure pelvis does not rotate. Do _10__ times. Restabilize pelvis. Repeat with other leg. Do __1_ sets, _1__ times per day.   [place] [facility] [address] [place], [place] [address]  [phone] (office) [phone] (fax)   

## 2015-03-01 NOTE — Assessment & Plan Note (Signed)
Bilateral low back pain without significant improvement in 2 weeks. Patient has failed conservative management. Continue physical therapy. Trial of different NSAIDs with Voltaren and methocarbamol. Obtain MRI. Follow-up after MRI. Consider diagnostic therapeutic SI joint injections.
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
## 2015-03-01 NOTE — Therapy (Signed)
[facility] [place] [address] [place] [address] [place], [place], [address] Phone: [phone]   Fax:  [phone]  Physical Therapy Treatment  Patient Details  Name: Adrian Chan MRN: [id number] Date of Birth: [birth year] Referring Provider: Dr Teressa Lower  Encounter Date: 03/01/2015      PT End of Session - 03/01/15 0804    Visit Number 3   Number of Visits 12   Date for PT Re-Evaluation 04/05/15   PT Start Time 0801   PT Stop Time 0903   PT Time Calculation (min) 62 min   Activity Tolerance Patient tolerated treatment well;No increased pain      Past Medical History  Diagnosis Date  . Reflux     No past surgical history on file.  There were no vitals filed for this visit.  Visit Diagnosis:  Pain, neck  Bilateral low back pain without sciatica  Back stiffness  Stiffness of neck      Subjective Assessment - 03/01/15 0805    Subjective Pt reports he completes HEP 3x/ day.   Neck and midback have calmed down, now biggest issue is the persistant pain in the low back across beltline.  Has been using TENS at home.    Currently in Pain? Yes   Pain Score 5    Pain Location Back   Pain Orientation Lower;Left;Right   Pain Descriptors / Indicators Burning;Dull   Aggravating Factors  lifting, bending over   Pain Relieving Factors TENS, heat, ice.            [facility] Adult PT Treatment/Exercise - 03/01/15 0001    Lumbar Exercises: Stretches   Passive Hamstring Stretch 2 reps;30 seconds  supine with strap   Passive Hamstring Stretch Limitations switched to sitting x 1 reps    Single Knee to Chest Stretch 2 reps;20 seconds   Lower Trunk Rotation 5 reps   Piriformis Stretch 1 rep;30 seconds  sitting    Lumbar Exercises: Aerobic   Stationary Bike NuStep L4: arms and legs x 5.5 min    Lumbar Exercises: Supine   Ab Set 10 reps;5 seconds   Clam 10 reps   Bent Knee Raise 10 reps   Lumbar Exercises: Sidelying   Other Sidelying  Lumbar Exercises upper trunk/lower trunk rotations with red band x 10 each side   Moist Heat Therapy   Number Minutes Moist Heat 15 Minutes   Moist Heat Location Lumbar Spine   Electrical Stimulation   Electrical Stimulation Location lumbar   Electrical Stimulation Action IFC   Electrical Stimulation Parameters to tolerance   Electrical Stimulation Goals Pain     Sit to/from stand with core engaged x 8 reps.  Chin tucks x 10 reps with tactile cues to improve form.         PT Education - 03/01/15 0850    Education provided Yes   Education Details HEP - transverse ab series   Person(s) Educated Patient   Methods Handout;Explanation   Comprehension Verbalized understanding;Returned demonstration             PT Long Term Goals - 02/22/15 0910    PT LONG TERM GOAL #1   Title I with advance HEP( 04/05/15)    Time 6   Period Weeks   Status New   PT LONG TERM GOAL #2   Title perform cervical ROM without pain or limitations  to allow him to return to full work ( 04/05/15)    Time 6   Period Tania Ade  Status New   PT LONG TERM GOAL #3   Title perform lumbar ROM without pain or limitations to 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  Pt was able to easily engage transverse abdominals with exercise and utilize with sit to/from stand. Reported decreased pain in LB with use of MHP and estim at end of session. Progressing well towards established goals.    Pt will benefit from skilled therapeutic intervention in order to improve on the following deficits  Hypomobility;Pain;Increased muscle spasms;Impaired UE functional use;Decreased range of motion   Rehab Potential Good   PT Frequency 2x / week   PT Duration 6 weeks   PT Treatment/Interventions Ultrasound;Traction;Neuromuscular re-education;Patient/family education;Passive range of motion;Dry needling;Cryotherapy;Electrical Stimulation;Moist Heat;Therapeutic exercise;Manual techniques;Taping   PT Next Visit Plan Assess cervical ROM. Review transverse abdominal series.  Continue progressive stretching for back and LE, postural strengthening.  Manual / TDN/ modalities ase needed.    Consulted and Agree with Plan of Care Patient        Problem List Patient Active Problem List   Diagnosis Date Noted  . Cervical paraspinal muscle spasm 01/20/2015  . Lumbosacral strain 01/20/2015   [name], PTA 03/01/2015 8:58 AM  [facility] [address] [place] [place] [address] [place], [place], [address] Phone: [phone]   Fax:  [phone]  Name: Adrian Chan MRN: [id number] Date of Birth: 04/21/1980

## 2015-03-03 ENCOUNTER — Ambulatory Visit (INDEPENDENT_AMBULATORY_CARE_PROVIDER_SITE_OTHER): Payer: BLUE CROSS/BLUE SHIELD | Admitting: Physical Therapy

## 2015-03-03 DIAGNOSIS — M545 Low back pain, unspecified: Secondary | ICD-10-CM

## 2015-03-03 DIAGNOSIS — M436 Torticollis: Secondary | ICD-10-CM | POA: Diagnosis not present

## 2015-03-03 DIAGNOSIS — M2560 Stiffness of unspecified joint, not elsewhere classified: Secondary | ICD-10-CM | POA: Diagnosis not present

## 2015-03-03 DIAGNOSIS — M2569 Stiffness of other specified joint, not elsewhere classified: Secondary | ICD-10-CM

## 2015-03-03 NOTE — Therapy (Signed)
[facility] [place] [address] [place] [address] [place], [place], [address] Phone: [phone]   Fax:  [phone]  Physical Therapy Treatment  Patient Details  Name: Adrian Chan MRN: [id number] Date of Birth: March 05, 1980 Referring Provider: Dr. Denyse Amass  Encounter Date: 03/03/2015      PT End of Session - 03/03/15 0842    Visit Number 4   Number of Visits 12   Date for PT Re-Evaluation 04/05/15   PT Start Time 0802   PT Stop Time 0857   PT Time Calculation (min) 55 min   Activity Tolerance Patient tolerated treatment well      Past Medical History  Diagnosis Date  . Reflux     No past surgical history on file.  There were no vitals filed for this visit.  Visit Diagnosis:  Bilateral low back pain without sciatica  Back stiffness  Stiffness of neck      Subjective Assessment - 03/03/15 0804    Subjective Pt reports he may have overdone it at work.  Had to lift heavy equipment, "there weren't enough people there to help lift it".  Reporting increased low back pain.     Currently in Pain? Yes   Pain Score 7    Pain Location Back   Pain Orientation Left;Right   Pain Descriptors / Indicators Burning   Aggravating Factors  lifting, pushing, bending over   Pain Relieving Factors TENS, heat, ice            [facility] PT Assessment - 03/03/15 0001    Assessment   Medical Diagnosis cervical and lumbar sprain   Referring Provider Dr. Denyse Amass   Onset Date/Surgical Date 12/31/14           [facility] Adult PT Treatment/Exercise - 03/03/15 0001    Neck Exercises: Seated   Other Seated Exercise Articulating roll down to LB stretch and roll back up to upright sitting x 8 reps    Lumbar Exercises: Stretches   Passive Hamstring Stretch 2 reps;30 seconds  seated with straight back   Single Knee to Chest Stretch 2 reps;20 seconds  with knee pulled to opp shoulder    Piriformis Stretch 1 rep;30 seconds   Lumbar Exercises: Aerobic   Stationary Bike NuStep L4: arms and legs x 5.5 min    Lumbar Exercises: Supine   Clam 10 reps  each leg with ab set   Heel Slides 10 reps  each leg with ab set   Bent Knee Raise 10 reps  each side with ab set   Lumbar Exercises: Prone   Opposite Arm/Leg Raise Right arm/Left leg;Left arm/Right leg;5 reps  2 sets   Other Prone Lumbar Exercises Prone pelvic press x 5 sec hold x 10 reps.    Other Prone Lumbar Exercises Childs pose for 4 breaths, 2 reps, with lateral trunk flexion each side 4 breaths x 2 reps    Modalities   Modalities Electrical Stimulation;Moist Heat   Moist Heat Therapy   Number Minutes Moist Heat 15 Minutes   Moist Heat Location Lumbar Spine   Electrical Stimulation   Electrical Stimulation Location lumbar paraspinals   Electrical Stimulation Action IFC   Electrical Stimulation Parameters to tolerance    Electrical Stimulation Goals Pain            PT Long Term Goals - 03/03/15 5284    PT LONG TERM GOAL #1   Title I with advance HEP( 04/05/15)    Time 6   Period Weeks  Status On-going   PT LONG TERM GOAL #2   Title perform cervical ROM without pain or limitations  to allow him to return to full work ( 04/05/15)    Time 6   Period Weeks   Status On-going   PT LONG TERM GOAL #3   Title perform lumbar ROM without pain or limitations to 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  Good activation of multifidi with prone pelvic press.  Pt noted relief of some tightness with use of childs pose.  Pt reported decreased pain by 4 points with use of MHP/estim in  decompression position.  Progressing towards goals.    Pt will benefit from skilled therapeutic intervention in order to improve on the following deficits Hypomobility;Pain;Increased muscle spasms;Impaired UE functional use;Decreased range of motion   Rehab Potential Good   PT Frequency 2x / week   PT Duration 6 weeks   PT Treatment/Interventions Ultrasound;Traction;Neuromuscular re-education;Patient/family education;Passive range of motion;Dry needling;Cryotherapy;Electrical Stimulation;Moist Heat;Therapeutic exercise;Manual techniques;Taping   PT Next Visit Plan Assess cervical ROM.  Continue progressive stretching for back and LE, postural strengthening- assess response to pelvic press/ prone exercises and add to HEP if tolerated.   Manual / TDN/ modalities ase needed.    Consulted and Agree with Plan of Care Patient        Problem List Patient Active Problem List   Diagnosis Date Noted  . Cervical paraspinal muscle spasm 01/20/2015  . Lumbago 01/20/2015    [name], PTA 03/03/2015 8:56 AM  [facility] [address] [place] [address] [place], [place], [address] Phone: [phone]   Fax:  [phone]  Name: Adrian Chan MRN: [id number] Date of Birth: 03/01/1980

## 2015-03-07 ENCOUNTER — Encounter: Payer: Self-pay | Admitting: Physical Therapy

## 2015-03-07 ENCOUNTER — Other Ambulatory Visit: Payer: Self-pay

## 2015-03-09 ENCOUNTER — Ambulatory Visit (INDEPENDENT_AMBULATORY_CARE_PROVIDER_SITE_OTHER): Payer: BLUE CROSS/BLUE SHIELD | Admitting: Physical Therapy

## 2015-03-09 DIAGNOSIS — M436 Torticollis: Secondary | ICD-10-CM | POA: Diagnosis not present

## 2015-03-09 DIAGNOSIS — M545 Low back pain, unspecified: Secondary | ICD-10-CM

## 2015-03-09 DIAGNOSIS — M2560 Stiffness of unspecified joint, not elsewhere classified: Secondary | ICD-10-CM | POA: Diagnosis not present

## 2015-03-09 DIAGNOSIS — M2569 Stiffness of other specified joint, not elsewhere classified: Secondary | ICD-10-CM

## 2015-03-09 NOTE — Patient Instructions (Signed)
Arm / Leg Lift: Opposite (Prone)    Lift right leg and opposite arm __1__ inches from floor, keeping knee locked. Repeat _10___ times per set. Do _1-2___ sets per session. Do __1__ sessions per day.  http://[email or web address].us/[email or web address]   Pelvic Press     Place hands under belly between navel and pubic bone, palms up. Feel pressure on hands. Increase pressure on hands by pressing pelvis down. This is NOT a pelvic tilt. Hold __5_ seconds. Relax. Repeat _10__ times. Once a day.  KNEE: Flexion - Prone   Hold pelvic press. Bend knee, then return the foot down. Repeat on opposite leg. Do not raise hips. _10__ reps per set. When this is mastered, pull both heels up at same time, x 10 reps.  Once a day  Axial Extension- Upper body sequence * always start with pelvic press    Lie on stomach with forehead resting on floor and arms at sides. Tuck chin in and raise head from floor without bending it up or down. Repeat ___10_ times per set. Do __1__ sets per session. Do _1___ sessions per day.   [facility] [address] [place], [place] [address]  [phone] (office) [phone] (fax)

## 2015-03-09 NOTE — Therapy (Addendum)
[place], [place], [address] Phone: [phone]   Fax:  [phone]  Physical Therapy Treatment  Patient Details  Name: Adrian Chan MRN: [id number] Date of Birth: 08-Oct-1980 Referring Provider: Dr. Georgina Snell  Encounter Date: 03/09/2015      PT End of Session - 03/09/15 0806    Visit Number 5   Number of Visits 12   Date for PT Re-Evaluation 04/05/15   PT Start Time 0802   PT Stop Time 0842   PT Time Calculation (min) 40 min      Past Medical History  Diagnosis Date  . Reflux     No past surgical history on file.  There were no vitals filed for this visit.  Visit Diagnosis:  Back stiffness  Stiffness of neck  Bilateral low back pain without sciatica      Subjective Assessment - 03/09/15 0804    Subjective Pt reports he has had a couple "good days".  Other things going on in life, so hasn't been doing as many exercises.  Just very tired today due to lack of sleep.    Currently in Pain? No/denies            [facility] PT Assessment - 03/09/15 0001    Assessment   Medical Diagnosis cervical and lumbar sprain   Referring Provider Dr. Georgina Snell   Onset Date/Surgical Date 12/31/14   Next MD Visit after PT   Prior Therapy none   AROM   Cervical Flexion 55   Cervical Extension 50   Cervical - Right Side Bend 35  tight   Cervical - Left Side Bend 33  tight   Cervical - Right Rotation 80   Cervical - Left Rotation 77           [facility] Adult PT Treatment/Exercise - 03/09/15 0001    Neck Exercises: Seated   Neck Retraction 15 reps;3 secs  chin tucks    Neck Retraction Limitations tactile cues to improve form.    Neck Exercises: Prone   Axial Exentsion 10 reps  with pelvic press   Lumbar Exercises: Stretches   Passive Hamstring Stretch 2 reps;30 seconds  seated with straight back   Single Knee to Chest Stretch 2 reps;20 seconds  with knee pulled to opp shoulder    Lower Trunk  Rotation 5 reps  each direction   Lumbar Exercises: Aerobic   Stationary Bike NuStep L4: arms and legs x 5.5 min    Lumbar Exercises: Prone   Opposite Arm/Leg Raise Right arm/Left leg;Left arm/Right leg;10 reps  2 sets   Other Prone Lumbar Exercises Prone pelvic press x 5 sec hold x 10 reps. Repeated with unilateral knee bend x 5 reps each leg.    Other Prone Lumbar Exercises Childs pose for 4 breaths, 2 reps, with lateral trunk flexion each side 4 breaths x 2 reps    Lumbar Exercises: Quadruped   Madcat/Old Horse 10 reps   Modalities   Modalities --  pt declined, will do at home if needed.    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  Pt declined modalities as he was not in any discomfort during or after exercise.  Pt making good progress towards goals with decreased pain level and improving ROM.     Pt will benefit from skilled therapeutic intervention in order to improve on the following deficits  Hypomobility;Pain;Increased muscle spasms;Impaired UE functional use;Decreased range of motion   Rehab Potential Good   PT Frequency 2x / week   PT Duration 6 weeks   PT Treatment/Interventions Ultrasound;Traction;Neuromuscular re-education;Patient/family education;Passive range of motion;Dry needling;Cryotherapy;Electrical Stimulation;Moist Heat;Therapeutic exercise;Manual techniques;Taping   PT Next Visit Plan Continue progressive postural strengthening/ stretches.  Modalities and manual therapy as needed.    Consulted and Agree with Plan of Care Patient        Problem List Patient Active Problem List   Diagnosis Date Noted  . Cervical paraspinal muscle spasm 01/20/2015  . Lumbago 01/20/2015   Kerin Perna, PTA 03/09/2015 8:48 AM  [facility] [place], [place], [address] Phone: [phone]   Fax:  [phone]  Name: Adrian Chan MRN: [id number] Date of Birth: 08-20-1980    PHYSICAL THERAPY DISCHARGE SUMMARY  Visits from Start of Care: 5  Current functional level related to goals / functional outcomes: See above for status at last visit   Remaining deficits: unknown   Education / Equipment: HEP  Plan:                                                    Patient goals were not met. Patient is being discharged due to a change in medical status. Patient had not returned for treatment, looking at MD notes he has been referred to a neurologist.  ?????    Jeral Pinch, PT 03/31/2015 8:11 AM

## 2015-03-14 ENCOUNTER — Ambulatory Visit (INDEPENDENT_AMBULATORY_CARE_PROVIDER_SITE_OTHER): Payer: BLUE CROSS/BLUE SHIELD

## 2015-03-14 DIAGNOSIS — M5126 Other intervertebral disc displacement, lumbar region: Secondary | ICD-10-CM | POA: Diagnosis not present

## 2015-03-14 DIAGNOSIS — S39012A Strain of muscle, fascia and tendon of lower back, initial encounter: Secondary | ICD-10-CM

## 2015-03-14 NOTE — Progress Notes (Signed)
Quick Note:  MRI shows some changes with some bulging. F/u in clinic for further discussion ______

## 2015-03-15 ENCOUNTER — Encounter: Payer: Self-pay | Admitting: Family Medicine

## 2015-03-15 ENCOUNTER — Ambulatory Visit: Payer: Self-pay | Admitting: Family Medicine

## 2015-03-15 ENCOUNTER — Ambulatory Visit (INDEPENDENT_AMBULATORY_CARE_PROVIDER_SITE_OTHER): Payer: BLUE CROSS/BLUE SHIELD | Admitting: Family Medicine

## 2015-03-15 ENCOUNTER — Encounter: Payer: BLUE CROSS/BLUE SHIELD | Admitting: Physical Therapy

## 2015-03-15 VITALS — BP 134/80 | HR 58 | Wt 190.0 lb

## 2015-03-15 DIAGNOSIS — M545 Low back pain, unspecified: Secondary | ICD-10-CM

## 2015-03-15 MED ORDER — AMITRIPTYLINE HCL 25 MG PO TABS: 25.0000 mg | ORAL_TABLET | Freq: Every evening | ORAL | Status: DC | PRN
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
## 2015-03-15 NOTE — Assessment & Plan Note (Signed)
Patient has pain in the high lumbar area consistent with the abnormality seen on MRI. He's had 2 months of physical therapy and rest and has failed to improve despite adequate conservative management. At this time I'm unclear as to the cause of his pain. Given that the location of his pain is consistent with the MRI findings I feel is reasonable to presume that the subligamentous protrusion is likely the cause or related to the cause of his pain. At this point I feel is reasonable to proceed with a neurosurgical consultation to see if there is a surgical option. Additionally as his pain is predominantly at night I feel it's reasonable to try amitriptyline for control of pain at bedtime. Recheck following neurosurgical consultation.

## 2015-03-15 NOTE — Patient Instructions (Signed)
Thank you for coming in today. You were seen in follow up of your back pain. Your MRI shows a fragment of a disk or a bleed/clot at the L1 level of your spine.  We will refer you to a neurosurgeon to discuss potential surgical interventions to alleviate your pain.  Please follow up as needed.

## 2015-03-29 ENCOUNTER — Encounter: Payer: Self-pay | Admitting: Family Medicine

## 2015-03-29 ENCOUNTER — Ambulatory Visit (INDEPENDENT_AMBULATORY_CARE_PROVIDER_SITE_OTHER): Payer: BLUE CROSS/BLUE SHIELD | Admitting: Family Medicine

## 2015-03-29 VITALS — BP 136/98 | HR 63 | Wt 189.0 lb

## 2015-03-29 DIAGNOSIS — M545 Low back pain, unspecified: Secondary | ICD-10-CM

## 2015-03-29 MED ORDER — DICLOFENAC SODIUM 2 % TD SOLN: 2.0000 | Freq: Two times a day (BID) | TRANSDERMAL | Status: DC

## 2015-03-29 MED ORDER — DULOXETINE HCL 30 MG PO CPEP: ORAL_CAPSULE | ORAL | Status: DC

## 2015-03-29 NOTE — Assessment & Plan Note (Signed)
The cause of the patient's pain is somewhat confusing. He has an abnormality on his MRI that is likely not causing the pain for neurosurgery. He has failed to improve with routine conservative treatment including rest medications physical therapy. Plan to continue physical therapy as well as trial of Cymbalta and Pennsaid. Additionally I think is reasonable to pursue a second opinion with neurosurgery at [facility]. Recheck in a month or 2.
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
## 2015-03-29 NOTE — Patient Instructions (Signed)
Thank you for coming in today. Take cymbalta daily 1 pill for 1 week then 2 pills daily.  You will get this medicine cheaper by searching for a manufacturer's coupon online.  Used Pennsaid up to 4 times daily  expecting here from [facility] Return in a month

## 2015-04-05 ENCOUNTER — Other Ambulatory Visit: Payer: Self-pay | Admitting: General Practice

## 2015-04-05 DIAGNOSIS — M545 Low back pain, unspecified: Secondary | ICD-10-CM

## 2015-04-06 ENCOUNTER — Ambulatory Visit: Payer: BLUE CROSS/BLUE SHIELD | Admitting: Rehabilitative and Restorative Service Providers"

## 2015-04-15 ENCOUNTER — Ambulatory Visit: Payer: Self-pay | Admitting: Physical Therapy

## 2015-07-11 ENCOUNTER — Encounter: Payer: Self-pay | Admitting: Family Medicine

## 2015-07-11 ENCOUNTER — Ambulatory Visit (INDEPENDENT_AMBULATORY_CARE_PROVIDER_SITE_OTHER): Payer: BLUE CROSS/BLUE SHIELD | Admitting: Family Medicine

## 2015-07-11 VITALS — BP 141/98 | HR 83 | Wt 185.0 lb

## 2015-07-11 DIAGNOSIS — M545 Low back pain, unspecified: Secondary | ICD-10-CM

## 2015-07-11 MED ORDER — CYCLOBENZAPRINE HCL 10 MG PO TABS: 10.0000 mg | ORAL_TABLET | Freq: Three times a day (TID) | ORAL | Status: AC | PRN

## 2015-07-11 NOTE — Patient Instructions (Signed)
Thank you for coming in today. Use flexeril as needed.  Return as needed.  Come back or go to the emergency room if you notice new weakness new numbness problems walking or bowel or bladder problems.
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
## 2015-10-13 ENCOUNTER — Telehealth: Payer: Self-pay
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
## 2015-10-14 NOTE — Telephone Encounter (Signed)
Pt returned call. Stated that he needs some form of documentation stating that during the time he was being treated he was under orders to work intermittently. Please advise on what to do?

## 2015-10-17 NOTE — Telephone Encounter (Signed)
I think we can print the office notes and especially the FMLA form we scanned which says exactly that.   Adrian Chan

## 2015-10-18 NOTE — Telephone Encounter (Signed)
Pt stated that he needed a specific document generated. Would it be best if pt came in for visit?   

## 2015-10-18 NOTE — Telephone Encounter (Signed)
Pt stated that he needed a specific document generated. Would it be best if pt came in for visit?

## 2015-10-19 NOTE — Telephone Encounter (Signed)
Yes very much so

## 2015-10-21 NOTE — Telephone Encounter (Signed)
Left detailed vm for pt advising that it would be best if came in for a visit to discuss the document he is requesting. Left callback information and requested pt schedule an appointment.

## 2015-10-27 ENCOUNTER — Ambulatory Visit (INDEPENDENT_AMBULATORY_CARE_PROVIDER_SITE_OTHER): Payer: BLUE CROSS/BLUE SHIELD | Admitting: Family Medicine

## 2015-10-27 ENCOUNTER — Encounter: Payer: Self-pay | Admitting: Family Medicine

## 2015-10-27 VITALS — BP 125/68 | HR 60 | Wt 187.0 lb

## 2015-10-27 DIAGNOSIS — M545 Low back pain, unspecified: Secondary | ICD-10-CM
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
